# Patient Record
Sex: Female | Born: 1975 | Race: White | Hispanic: No | Marital: Married | Smoking: Never smoker
Health system: Southern US, Community
[De-identification: ages and names within clinical notes are randomized; demographics above are authoritative.]

## PROBLEM LIST (undated history)

## (undated) DIAGNOSIS — I10 Essential (primary) hypertension: Secondary | ICD-10-CM

## (undated) DIAGNOSIS — R0602 Shortness of breath: Secondary | ICD-10-CM

## (undated) HISTORY — PX: WISDOM TOOTH EXTRACTION: SHX21

---

## 2007-09-19 ENCOUNTER — Inpatient Hospital Stay (HOSPITAL_COMMUNITY): Admission: AD | Admit: 2007-09-19 | Discharge: 2007-09-22 | Payer: Self-pay | Admitting: Obstetrics and Gynecology

## 2008-07-23 ENCOUNTER — Emergency Department (HOSPITAL_COMMUNITY): Admission: EM | Admit: 2008-07-23 | Discharge: 2008-07-23 | Payer: Self-pay | Admitting: Family Medicine

## 2010-05-24 NOTE — L&D Delivery Note (Signed)
Delivery Note At 11:36 AM a viable and healthy female was delivered via Vaginal, Spontaneous Delivery (Presentation:Left Occiput ;Anterior  ).  APGAR: 9, 9; weight 6 lb 10.5 oz (3020 g).   Placenta status: Intact, Spontaneous.  Cord: 3 vessels   Anesthesia: Epidural  Episiotomy: None Lacerations: 2nd degree Suture Repair: 3.0 vicryl Est. Blood Loss (mL): 250  Mom to postpartum.  Baby to nursery-stable.  Merrisa Skorupski H. 04/14/2011, 12:04 PM

## 2011-01-14 ENCOUNTER — Encounter (HOSPITAL_COMMUNITY): Payer: Self-pay

## 2011-01-14 ENCOUNTER — Other Ambulatory Visit: Payer: Self-pay

## 2011-01-14 ENCOUNTER — Inpatient Hospital Stay (HOSPITAL_COMMUNITY): Payer: 59

## 2011-01-14 ENCOUNTER — Observation Stay (HOSPITAL_COMMUNITY)
Admission: AD | Admit: 2011-01-14 | Discharge: 2011-01-15 | Disposition: A | Discharge status: Home / Self Care | Payer: 59 | Source: Ambulatory Visit | Attending: Obstetrics and Gynecology | Admitting: Obstetrics and Gynecology

## 2011-01-14 DIAGNOSIS — O99891 Other specified diseases and conditions complicating pregnancy: Principal | ICD-10-CM | POA: Insufficient documentation

## 2011-01-14 DIAGNOSIS — R0602 Shortness of breath: Secondary | ICD-10-CM | POA: Insufficient documentation

## 2011-01-14 DIAGNOSIS — Z331 Pregnant state, incidental: Secondary | ICD-10-CM

## 2011-01-14 HISTORY — DX: Essential (primary) hypertension: I10

## 2011-01-14 LAB — CBC
MCH: 27.3 pg (ref 26.0–34.0)
MCV: 83 fL (ref 78.0–100.0)
Platelets: 280 10*3/uL (ref 150–400)
RDW: 13.9 % (ref 11.5–15.5)
WBC: 11.7 10*3/uL — ABNORMAL HIGH (ref 4.0–10.5)

## 2011-01-14 LAB — COMPREHENSIVE METABOLIC PANEL
AST: 20 U/L (ref 0–37)
Albumin: 3.1 g/dL — ABNORMAL LOW (ref 3.5–5.2)
Calcium: 9.4 mg/dL (ref 8.4–10.5)
Creatinine, Ser: <0.47 mg/dL — ABNORMAL LOW (ref 0.50–1.10)
Sodium: 134 mEq/L — ABNORMAL LOW (ref 135–145)

## 2011-01-14 LAB — FIBRINOGEN: Fibrinogen: 401 mg/dL (ref 204–475)

## 2011-01-14 LAB — LIPASE, BLOOD: Lipase: 52 U/L (ref 11–59)

## 2011-01-14 MED ORDER — ALBUTEROL SULFATE (5 MG/ML) 0.5% IN NEBU
2.5000 mg | INHALATION_SOLUTION | Freq: Four times a day (QID) | RESPIRATORY_TRACT | Status: DC | PRN
  Administered 2011-01-14: 2.5 mg via RESPIRATORY_TRACT
  Filled 2011-01-14: qty 0.5

## 2011-01-14 MED ORDER — ZOLPIDEM TARTRATE 10 MG PO TABS: 10.0000 mg | ORAL_TABLET | Freq: Every evening | ORAL | Status: DC | PRN

## 2011-01-14 MED ORDER — ALBUTEROL SULFATE (5 MG/ML) 0.5% IN NEBU
INHALATION_SOLUTION | RESPIRATORY_TRACT | Status: AC
  Filled 2011-01-14: qty 0.5

## 2011-01-14 MED ORDER — ALBUTEROL SULFATE (5 MG/ML) 0.5% IN NEBU
2.5000 mg | INHALATION_SOLUTION | Freq: Once | RESPIRATORY_TRACT | Status: AC
  Administered 2011-01-14: 2.5 mg via RESPIRATORY_TRACT

## 2011-01-14 MED ORDER — IPRATROPIUM BROMIDE 0.02 % IN SOLN
0.5000 mg | Freq: Four times a day (QID) | RESPIRATORY_TRACT | Status: DC | PRN
  Filled 2011-01-14: qty 2.5

## 2011-01-14 MED ORDER — IPRATROPIUM BROMIDE 0.02 % IN SOLN
0.5000 mg | Freq: Once | RESPIRATORY_TRACT | Status: AC
  Administered 2011-01-14: 0.5 mg via RESPIRATORY_TRACT

## 2011-01-14 MED ORDER — ONDANSETRON HCL 4 MG/2ML IJ SOLN: 4.0000 mg | Freq: Three times a day (TID) | INTRAMUSCULAR | Status: DC | PRN

## 2011-01-14 MED ORDER — IPRATROPIUM BROMIDE 0.02 % IN SOLN
RESPIRATORY_TRACT | Status: AC
  Filled 2011-01-14: qty 2.5

## 2011-01-14 MED ORDER — FAMOTIDINE 20 MG PO TABS
20.0000 mg | ORAL_TABLET | Freq: Two times a day (BID) | ORAL | Status: DC
  Administered 2011-01-14 (×2): 20 mg via ORAL
  Filled 2011-01-14 (×2): qty 1

## 2011-01-14 NOTE — Consult Note (Signed)
Reviewed HPI/med hx/exam>order breathing tx, chest xray; check peak flows before and after treatment; call back with status after breathing treatment.

## 2011-01-14 NOTE — Consult Note (Signed)
Report pt continued status of shortness of breath>reviewed peak flow results>will be in to see patient.

## 2011-01-14 NOTE — Consult Note (Signed)
Reviewed pt status, slight improvement with breathing tx, still working to breath; reviewed xray results and RUQ pain; obtain cbc, cmp, amylase, lipase and call with peak flow results.

## 2011-01-14 NOTE — Progress Notes (Signed)
WMuhammad, CNM back in room w/pt/RN. Will order bloodwork, and call RT for peakflow

## 2011-01-14 NOTE — Progress Notes (Signed)
Pt states she is having shortness of breath and R upper rib pain. Pt denies any bleeding or leaking or contractions. Pt does have asthma but does not feel like this is what it is. Has used her inhaler without results.

## 2011-01-14 NOTE — H&P (Signed)
Please see full H&P by NP.   Pt is 35 yo at 25 weeks c/o worsening SOB last night and continuing to this am.  Pt states that she feels like she has to take deep breaths especially with activity.  Friend states that Pt sounded winded just talking on phone.  Pt say she also has a deep pain on right hand lateral side at level of liver.  The pain is not sharp and makes her feel like she could not take a deep breath but actually can.  Nothing seems to relieve either pain or sensation of SOB.  Pt uses a MDI albuterol inhaler for mild asthma.  No cough or UTI sx.  No ctxes or bleeding; reports fetal movement.    Filed Vitals:   01/14/11 1005 01/14/11 1058  BP: 130/75   Pulse: 88   Temp: 98.5 F (36.9 C)   TempSrc: Oral   Resp: 24   Height: 5\' 2"  (1.575 m)   Weight: 65.499 kg (144 lb 6.4 oz)   SpO2: 99% 98%   SaO2 consistently 95-99% on room air but can go down when pt not breathing in deeply to 84-90%.  Peak Flow after nebulizer by respiratory was 350.  LABS :  WBC 11.7, H/H 11/35, plt 280.  LFTS 20/11.  Fibrinogen 401, BUN/Cr 11/<0.47.  CXR:  No cardiopulm abnormalities seen.  Lungs clear.  PE:  Pt gasps audibly with every breath.  No wheezing.  Lungs CTA B- absolutely clear. Cor:  RRR no MRG. Ex:  Completely normal bilaterally-no cords, edema, erythema, or homan's. +FHTs.  A:  25 weeks normal pregnancy with SOB of unknown reason.  SAO2 drops occasionally.  Pt has hx of asthma but peak flow is great and she is not responding to nebulizer.   Diff Dx: 1.  Asthma- peak flow fine.  Will have respiratory continue to see. 2.  Early pneumonia- CXR clear now and afeb but she does have mild elevation of WBC.  Watch temps and recheck CBC in am.  Consider antibiotics. 3.  PE- currently no evidence of any clotting.  If SOB, SaO2 worsens may consider Spiral CT of chest with shielding. 4.  Cardiac reason- check EKG now.     P:  Admit 24 hour obs for further evaluation.  Continue Respiratory nebs.   Check EKG now and CBC in am.  Consider repeat CXR in am or Spiral CT if worsens.  Consider antibiotics.  Latoria Dry A

## 2011-01-14 NOTE — Progress Notes (Signed)
Pt states used inhaler last pm, this am is feeling more shortness of breath. Denies anxiety. No bleeding or lof, +FM.

## 2011-01-15 LAB — CBC
HCT: 34.2 % — ABNORMAL LOW (ref 36.0–46.0)
Hemoglobin: 11.2 g/dL — ABNORMAL LOW (ref 12.0–15.0)
MCH: 27.3 pg (ref 26.0–34.0)
MCHC: 32.7 g/dL (ref 30.0–36.0)

## 2011-01-15 LAB — COMPREHENSIVE METABOLIC PANEL
Alkaline Phosphatase: 67 U/L (ref 39–117)
BUN: 10 mg/dL (ref 6–23)
CO2: 24 mEq/L (ref 19–32)
Calcium: 8.9 mg/dL (ref 8.4–10.5)
GFR calc Af Amer: >60 mL/min (ref >60)
GFR calc non Af Amer: >60 mL/min (ref >60)
Glucose, Bld: 82 mg/dL (ref 70–99)
Total Protein: 5.7 g/dL — ABNORMAL LOW (ref 6.0–8.3)

## 2011-01-15 NOTE — Progress Notes (Signed)
UR Chart review completed.  

## 2011-01-15 NOTE — Discharge Summary (Signed)
Ana Jenkins is doing much better this morning. She is having no shortness of breath or difficulty breathing whatsoever.  She has been off of oxygen for least the past 4-5 hours. Her O2 sats have been normal. Her hemoglobin this morning was 11.2, white count 10,800, and platelet count 253,000. Her and comprehensive metabolic profile were normal. She notes good fetal movement and is desirous of discharge.  Discharge diagnoses:  1. 25-26 week intrauterine pregnancy, undelivered  #2 episode of difficulty breathing and shortness of breath, resolved  Plan-the patient was given a Rx  for a ProAir HFA  90 mcg inhaler with appropriate instructions. She will keep her followup appointments in the office as scheduled or be seen as needed. Condition on discharge-improved.

## 2011-01-15 NOTE — Discharge Summary (Signed)
Instructions given to pt. Per Dr. Aldona Bar and pt. And significant other state understanding of these instructions.

## 2011-01-15 NOTE — Progress Notes (Signed)
  Much improved this AM; has not been on oxygen for a long while and breathing normally.  Labs this AM OK. Will discharge to home.

## 2011-01-28 LAB — ABO/RH: RH Type: POSITIVE

## 2011-01-28 LAB — HEPATITIS B SURFACE ANTIGEN: Hepatitis B Surface Ag: NEGATIVE

## 2011-01-28 LAB — RUBELLA ANTIBODY, IGM: Rubella: IMMUNE

## 2011-02-16 LAB — CBC
HCT: 30.6 — ABNORMAL LOW
HCT: 36.5
Hemoglobin: 10.5 — ABNORMAL LOW
Hemoglobin: 12.6
MCHC: 34.4
MCV: 81.4
Platelets: 278
RBC: 3.76 — ABNORMAL LOW
RDW: 14
WBC: 15.1 — ABNORMAL HIGH

## 2011-02-16 LAB — COMPREHENSIVE METABOLIC PANEL
ALT: 15
AST: 21
Alkaline Phosphatase: 114
Calcium: 9.5
GFR calc Af Amer: >60
Glucose, Bld: 79
Potassium: 4.2
Sodium: 134 — ABNORMAL LOW
Total Protein: 5.7 — ABNORMAL LOW

## 2011-02-16 LAB — LACTATE DEHYDROGENASE: LDH: 145

## 2011-04-14 ENCOUNTER — Encounter (HOSPITAL_COMMUNITY): Payer: Self-pay | Admitting: Anesthesiology

## 2011-04-14 ENCOUNTER — Inpatient Hospital Stay (HOSPITAL_COMMUNITY)
Admission: AD | Admit: 2011-04-14 | Discharge: 2011-04-15 | DRG: 775 | Disposition: A | Discharge status: Home / Self Care | Payer: 59 | Source: Ambulatory Visit | Attending: Obstetrics and Gynecology | Admitting: Obstetrics and Gynecology

## 2011-04-14 ENCOUNTER — Encounter (HOSPITAL_COMMUNITY): Payer: Self-pay | Admitting: Obstetrics

## 2011-04-14 ENCOUNTER — Inpatient Hospital Stay (HOSPITAL_COMMUNITY): Payer: 59 | Admitting: Anesthesiology

## 2011-04-14 DIAGNOSIS — O09529 Supervision of elderly multigravida, unspecified trimester: Secondary | ICD-10-CM | POA: Diagnosis present

## 2011-04-14 HISTORY — DX: Shortness of breath: R06.02

## 2011-04-14 LAB — CBC
HCT: 36.2 % (ref 36.0–46.0)
MCH: 27.4 pg (ref 26.0–34.0)
MCV: 81.3 fL (ref 78.0–100.0)
Platelets: 258 10*3/uL (ref 150–400)
RDW: 13.8 % (ref 11.5–15.5)

## 2011-04-14 LAB — RPR: RPR: NONREACTIVE

## 2011-04-14 LAB — STREP B DNA PROBE: GBS: NEGATIVE

## 2011-04-14 LAB — SYPHILIS: RPR W/REFLEX TO RPR TITER AND TREPONEMAL ANTIBODIES, TRADITIONAL SCREENING AND DIAGNOSIS ALGORITHM: RPR Ser Ql: NONREACTIVE

## 2011-04-14 MED ORDER — FLEET ENEMA 7-19 GM/118ML RE ENEM: 1.0000 | ENEMA | RECTAL | Status: DC | PRN

## 2011-04-14 MED ORDER — BUTORPHANOL TARTRATE 2 MG/ML IJ SOLN: 1.0000 mg | INTRAMUSCULAR | Status: DC | PRN

## 2011-04-14 MED ORDER — DIPHENHYDRAMINE HCL 50 MG/ML IJ SOLN: 12.5000 mg | INTRAMUSCULAR | Status: DC | PRN

## 2011-04-14 MED ORDER — PHENYLEPHRINE 40 MCG/ML (10ML) SYRINGE FOR IV PUSH (FOR BLOOD PRESSURE SUPPORT): 80.0000 ug | PREFILLED_SYRINGE | INTRAVENOUS | Status: DC | PRN

## 2011-04-14 MED ORDER — LIDOCAINE HCL (PF) 1 % IJ SOLN
30.0000 mL | INTRAMUSCULAR | Status: DC | PRN
  Administered 2011-04-14: 30 mL via SUBCUTANEOUS
  Filled 2011-04-14: qty 30

## 2011-04-14 MED ORDER — CITRIC ACID-SODIUM CITRATE 334-500 MG/5ML PO SOLN: 30.0000 mL | ORAL | Status: DC | PRN

## 2011-04-14 MED ORDER — PREDNISONE 10 MG PO TABS
30.0000 mg | ORAL_TABLET | Freq: Every day | ORAL | Status: DC
  Administered 2011-04-14: 30 mg via ORAL
  Filled 2011-04-14: qty 3

## 2011-04-14 MED ORDER — OXYTOCIN 20 UNITS IN LACTATED RINGERS INFUSION - SIMPLE
125.0000 mL/h | Freq: Once | INTRAVENOUS | Status: AC
  Administered 2011-04-14: 999 mL/h via INTRAVENOUS

## 2011-04-14 MED ORDER — FLUTICASONE PROPIONATE HFA 44 MCG/ACT IN AERO
2.0000 | INHALATION_SPRAY | Freq: Two times a day (BID) | RESPIRATORY_TRACT | Status: DC
  Administered 2011-04-14 – 2011-04-15 (×2): 2 via RESPIRATORY_TRACT
  Filled 2011-04-14: qty 10.6

## 2011-04-14 MED ORDER — PREDNISONE 50 MG PO TABS
60.0000 mg | ORAL_TABLET | Freq: Every day | ORAL | Status: DC
  Filled 2011-04-14 (×3): qty 1

## 2011-04-14 MED ORDER — SENNOSIDES-DOCUSATE SODIUM 8.6-50 MG PO TABS
2.0000 | ORAL_TABLET | Freq: Every day | ORAL | Status: DC
  Administered 2011-04-14: 2 via ORAL

## 2011-04-14 MED ORDER — ONDANSETRON HCL 4 MG/2ML IJ SOLN: 4.0000 mg | Freq: Four times a day (QID) | INTRAMUSCULAR | Status: DC | PRN

## 2011-04-14 MED ORDER — BENZOCAINE-MENTHOL 20-0.5 % EX AERO
INHALATION_SPRAY | CUTANEOUS | Status: AC
  Administered 2011-04-14: 1 via TOPICAL
  Filled 2011-04-14: qty 56

## 2011-04-14 MED ORDER — ONDANSETRON HCL 4 MG PO TABS: 4.0000 mg | ORAL_TABLET | ORAL | Status: DC | PRN

## 2011-04-14 MED ORDER — INFLUENZA VIRUS VACC SPLIT PF IM SUSP
0.5000 mL | INTRAMUSCULAR | Status: DC
  Filled 2011-04-14: qty 0.5

## 2011-04-14 MED ORDER — ONDANSETRON HCL 4 MG/2ML IJ SOLN: 4.0000 mg | INTRAMUSCULAR | Status: DC | PRN

## 2011-04-14 MED ORDER — PHENYLEPHRINE 40 MCG/ML (10ML) SYRINGE FOR IV PUSH (FOR BLOOD PRESSURE SUPPORT)
80.0000 ug | PREFILLED_SYRINGE | INTRAVENOUS | Status: DC | PRN
  Filled 2011-04-14: qty 5

## 2011-04-14 MED ORDER — IBUPROFEN 600 MG PO TABS
600.0000 mg | ORAL_TABLET | Freq: Four times a day (QID) | ORAL | Status: DC | PRN
  Administered 2011-04-14: 600 mg via ORAL
  Filled 2011-04-14: qty 1

## 2011-04-14 MED ORDER — ZOLPIDEM TARTRATE 5 MG PO TABS: 5.0000 mg | ORAL_TABLET | Freq: Every evening | ORAL | Status: DC | PRN

## 2011-04-14 MED ORDER — PRENATAL PLUS 27-1 MG PO TABS
1.0000 | ORAL_TABLET | Freq: Every day | ORAL | Status: DC
  Administered 2011-04-14 – 2011-04-15 (×2): 1 via ORAL
  Filled 2011-04-14 (×2): qty 1

## 2011-04-14 MED ORDER — ACETAMINOPHEN 325 MG PO TABS: 650.0000 mg | ORAL_TABLET | ORAL | Status: DC | PRN

## 2011-04-14 MED ORDER — TERBUTALINE SULFATE 1 MG/ML IJ SOLN: 0.2500 mg | Freq: Once | INTRAMUSCULAR | Status: DC | PRN

## 2011-04-14 MED ORDER — IBUPROFEN 600 MG PO TABS
600.0000 mg | ORAL_TABLET | Freq: Four times a day (QID) | ORAL | Status: DC
  Administered 2011-04-14 – 2011-04-15 (×4): 600 mg via ORAL
  Filled 2011-04-14 (×4): qty 1

## 2011-04-14 MED ORDER — METHYLERGONOVINE MALEATE 0.2 MG PO TABS: 0.2000 mg | ORAL_TABLET | ORAL | Status: DC | PRN

## 2011-04-14 MED ORDER — OXYTOCIN BOLUS FROM INFUSION
500.0000 mL | Freq: Once | INTRAVENOUS | Status: DC
  Filled 2011-04-14: qty 500

## 2011-04-14 MED ORDER — FENTANYL 2.5 MCG/ML BUPIVACAINE 1/10 % EPIDURAL INFUSION (WH - ANES)
14.0000 mL/h | INTRAMUSCULAR | Status: DC
  Administered 2011-04-14 (×2): 14 mL/h via EPIDURAL
  Filled 2011-04-14 (×3): qty 60

## 2011-04-14 MED ORDER — LACTATED RINGERS IV SOLN
INTRAVENOUS | Status: DC
  Administered 2011-04-14: 07:00:00 via INTRAVENOUS

## 2011-04-14 MED ORDER — SODIUM CHLORIDE 0.9 % IJ SOLN: 3.0000 mL | INTRAMUSCULAR | Status: DC | PRN

## 2011-04-14 MED ORDER — OXYTOCIN 20 UNITS IN LACTATED RINGERS INFUSION - SIMPLE
1.0000 m[IU]/min | INTRAVENOUS | Status: DC
  Administered 2011-04-14: 2 m[IU]/min via INTRAVENOUS
  Filled 2011-04-14: qty 1000

## 2011-04-14 MED ORDER — LACTATED RINGERS IV SOLN: 500.0000 mL | INTRAVENOUS | Status: DC | PRN

## 2011-04-14 MED ORDER — DIBUCAINE 1 % RE OINT: 1.0000 "application " | TOPICAL_OINTMENT | RECTAL | Status: DC | PRN

## 2011-04-14 MED ORDER — ALBUTEROL SULFATE (5 MG/ML) 0.5% IN NEBU
2.5000 mg | INHALATION_SOLUTION | RESPIRATORY_TRACT | Status: DC | PRN
  Administered 2011-04-14 (×2): 2.5 mg via RESPIRATORY_TRACT
  Filled 2011-04-14: qty 0.5

## 2011-04-14 MED ORDER — DIPHENHYDRAMINE HCL 25 MG PO CAPS: 25.0000 mg | ORAL_CAPSULE | Freq: Four times a day (QID) | ORAL | Status: DC | PRN

## 2011-04-14 MED ORDER — FENTANYL 2.5 MCG/ML BUPIVACAINE 1/10 % EPIDURAL INFUSION (WH - ANES)
INTRAMUSCULAR | Status: DC | PRN
  Administered 2011-04-14: 14 mL/h via EPIDURAL

## 2011-04-14 MED ORDER — BENZOCAINE-MENTHOL 20-0.5 % EX AERO
1.0000 "application " | INHALATION_SPRAY | CUTANEOUS | Status: DC | PRN
  Administered 2011-04-14: 1 via TOPICAL

## 2011-04-14 MED ORDER — SODIUM BICARBONATE 8.4 % IV SOLN
INTRAVENOUS | Status: DC | PRN
  Administered 2011-04-14: 5 mL via EPIDURAL

## 2011-04-14 MED ORDER — EPHEDRINE 5 MG/ML INJ
10.0000 mg | INTRAVENOUS | Status: DC | PRN
  Filled 2011-04-14: qty 4

## 2011-04-14 MED ORDER — METHYLERGONOVINE MALEATE 0.2 MG/ML IJ SOLN: 0.2000 mg | INTRAMUSCULAR | Status: DC | PRN

## 2011-04-14 MED ORDER — EPHEDRINE 5 MG/ML INJ: 10.0000 mg | INTRAVENOUS | Status: DC | PRN

## 2011-04-14 MED ORDER — LANOLIN HYDROUS EX OINT: TOPICAL_OINTMENT | CUTANEOUS | Status: DC | PRN

## 2011-04-14 MED ORDER — BUDESONIDE 180 MCG/ACT IN AEPB
1.0000 | INHALATION_SPRAY | Freq: Two times a day (BID) | RESPIRATORY_TRACT | Status: DC
  Administered 2011-04-14 – 2011-04-15 (×3): 1 via RESPIRATORY_TRACT
  Filled 2011-04-14: qty 1

## 2011-04-14 MED ORDER — SIMETHICONE 80 MG PO CHEW: 80.0000 mg | CHEWABLE_TABLET | ORAL | Status: DC | PRN

## 2011-04-14 MED ORDER — LACTATED RINGERS IV SOLN
500.0000 mL | Freq: Once | INTRAVENOUS | Status: AC
  Administered 2011-04-14: 500 mL via INTRAVENOUS

## 2011-04-14 MED ORDER — TETANUS-DIPHTH-ACELL PERTUSSIS 5-2.5-18.5 LF-MCG/0.5 IM SUSP
0.5000 mL | Freq: Once | INTRAMUSCULAR | Status: AC
  Administered 2011-04-15: 0.5 mL via INTRAMUSCULAR
  Filled 2011-04-14 (×2): qty 0.5

## 2011-04-14 MED ORDER — WITCH HAZEL-GLYCERIN EX PADS: 1.0000 "application " | MEDICATED_PAD | CUTANEOUS | Status: DC | PRN

## 2011-04-14 MED ORDER — PREDNISONE 20 MG PO TABS
30.0000 mg | ORAL_TABLET | Freq: Once | ORAL | Status: AC
  Administered 2011-04-14: 30 mg via ORAL
  Filled 2011-04-14: qty 1

## 2011-04-14 MED ORDER — OXYCODONE-ACETAMINOPHEN 5-325 MG PO TABS
1.0000 | ORAL_TABLET | ORAL | Status: DC | PRN
  Administered 2011-04-15: 1 via ORAL
  Filled 2011-04-14: qty 1

## 2011-04-14 MED ORDER — PREDNISONE 50 MG PO TABS
60.0000 mg | ORAL_TABLET | Freq: Every day | ORAL | Status: DC
  Administered 2011-04-15: 60 mg via ORAL
  Filled 2011-04-14 (×2): qty 1

## 2011-04-14 MED ORDER — ALBUTEROL SULFATE (5 MG/ML) 0.5% IN NEBU
2.5000 mg | INHALATION_SOLUTION | RESPIRATORY_TRACT | Status: DC | PRN
  Administered 2011-04-14: 2.5 mg via RESPIRATORY_TRACT
  Filled 2011-04-14: qty 0.5

## 2011-04-14 MED ORDER — OXYCODONE-ACETAMINOPHEN 5-325 MG PO TABS
2.0000 | ORAL_TABLET | ORAL | Status: DC | PRN
  Administered 2011-04-14: 2 via ORAL
  Filled 2011-04-14: qty 2

## 2011-04-14 NOTE — Progress Notes (Signed)
PT SAYS  SHE WENT TO FAMILY DR TODAY- FOR ASTHMA.    SAYS HER SROM AT 0045.   UC  STARTED ON WAY  TO HOSPITAL.   DENIES HSV AND MRSA.

## 2011-04-14 NOTE — Anesthesia Procedure Notes (Signed)
Epidural Patient location during procedure: OB  Preanesthetic Checklist Completed: patient identified, site marked, surgical consent, pre-op evaluation, timeout performed, IV checked, risks and benefits discussed and monitors and equipment checked  Epidural Patient position: sitting Prep: site prepped and draped and DuraPrep Patient monitoring: continuous pulse ox and blood pressure Approach: midline Injection technique: LOR air  Needle:  Needle type: Tuohy  Needle gauge: 17 G Needle length: 9 cm Needle insertion depth: 6 cm Catheter type: closed end flexible Catheter size: 19 Gauge Catheter at skin depth: 11 cm Test dose: negative  Assessment Events: blood not aspirated, injection not painful, no injection resistance, negative IV test and no paresthesia  Additional Notes Dosing of Epidural:  1st dose, through needle ............................................. epi 1:200K + Xylocaine 40 mg  2nd dose, through catheter, after waiting 3 minutes.....epi 1:200K + Xylocaine 60 mg  3rd dose, through catheter after waiting 3 minutes .............................Marcaine   5mg   ( mg Marcaine are expressed as equivilent  cc's medication removed from the 0.1%Bupiv / fentanyl syringe from L&D pump)  ( 2% Xylo charted as a single dose in Epic Meds for ease of charting; actual dosing was fractionated as above, for saftey's sake)  As each dose occurred, patient was free of IV sx; and patient exhibited no evidence of SA injection.  Patient is more comfortable after epidural dosed. Please see RN's note for documentation of vital signs,and FHR which are stable.       

## 2011-04-14 NOTE — Progress Notes (Signed)
CALLED COLEY, RN FOR ROOM ASSIGNMENT - SAYS WILL CALL BACK

## 2011-04-14 NOTE — Anesthesia Preprocedure Evaluation (Addendum)
Anesthesia Evaluation  Patient identified by MRN, date of birth, ID band Patient awake    Reviewed: Allergy & Precautions, H&P , Patient's Chart, lab work & pertinent test results  Airway Mallampati: II TM Distance: >3 FB Neck ROM: full    Dental  (+) Teeth Intact   Pulmonary asthma (Recent URI, triggering asthma attacks. Now on Prednisone, and doing better) ,  clear to auscultation        Cardiovascular hypertension (None now, only with prev preg), regular Normal    Neuro/Psych    GI/Hepatic   Endo/Other    Renal/GU      Musculoskeletal   Abdominal   Peds  Hematology   Anesthesia Other Findings       Reproductive/Obstetrics (+) Pregnancy                          Anesthesia Physical Anesthesia Plan  ASA: II  Anesthesia Plan: Epidural   Post-op Pain Management:    Induction:   Airway Management Planned:   Additional Equipment:   Intra-op Plan:   Post-operative Plan:   Informed Consent: I have reviewed the patients History and Physical, chart, labs and discussed the procedure including the risks, benefits and alternatives for the proposed anesthesia with the patient or authorized representative who has indicated his/her understanding and acceptance.   Dental Advisory Given  Plan Discussed with:   Anesthesia Plan Comments: (Labs checked- platelets confirmed with RN in room. Fetal heart tracing, per RN, reported to be stable enough for sitting procedure. Discussed epidural, and patient consents to the procedure:  included risk of possible headache,backache, failed block, allergic reaction, and nerve injury. This patient was asked if she had any questions or concerns before the procedure started. )        Anesthesia Quick Evaluation

## 2011-04-14 NOTE — H&P (Signed)
Ana Jenkins is a 35 y.o. female G2P1001 presenting for Labor Pts pregnancy has been complicated by asthma exacerbations.  She was seen by her PCP yesterday and placed on a prednisone burst of 60 mg for 5 days. Pt is also AMA.  First trimester screen was WNL, declined AFP. Had SROM after midnight. History OB History    Grav Para Term Preterm Abortions TAB SAB Ect Mult Living   2 1 1       1      Past Medical History  Diagnosis Date  . Hypertension   . Asthma   . Shortness of breath    Past Surgical History  Procedure Date  . Wisdom tooth extraction    Family History: family history is not on file. Social History:  reports that she has never smoked. She has never used smokeless tobacco. She reports that she does not drink alcohol or use illicit drugs.  ROS  Dilation: 9 Effacement (%): 100 Station: 0 Exam by:: C.Woods,RN Blood pressure 133/84, pulse 108, temperature 97.2 F (36.2 C), temperature source Oral, resp. rate 20, height 5\' 2"  (1.575 m), weight 73.993 kg (163 lb 2 oz), SpO2 97.00%. Exam Physical Exam  Prenatal labs: ABO, Rh: B/Positive/-- (09/06 0000) Antibody: Negative (09/06 0000) Rubella: Immune (09/06 0000) RPR: Nonreactive (11/21 0000)  HBsAg: Negative (09/06 0000)  HIV: Non-reactive (11/21 0000)  GBS: Negative (11/21 0000)   Assessment/Plan: 1) Admit 2) Expectant Management 3) Continue Prednisone burst  Ana Jenkins H. 04/14/2011, 10:36 AM

## 2011-04-15 LAB — CBC
MCH: 27.5 pg (ref 26.0–34.0)
MCHC: 32.9 g/dL (ref 30.0–36.0)
RDW: 14 % (ref 11.5–15.5)

## 2011-04-15 MED ORDER — ALBUTEROL SULFATE (5 MG/ML) 0.5% IN NEBU: 2.5000 mg | INHALATION_SOLUTION | RESPIRATORY_TRACT | Status: AC | PRN

## 2011-04-15 MED ORDER — IBUPROFEN 600 MG PO TABS: 600.0000 mg | ORAL_TABLET | Freq: Four times a day (QID) | ORAL | Status: AC

## 2011-04-15 NOTE — Discharge Summary (Signed)
Obstetric Discharge Summary Reason for Admission: onset of labor and rupture of membranes Prenatal Procedures: none Intrapartum Procedures: spontaneous vaginal delivery Postpartum Procedures: none Complications-Operative and Postpartum: none Hemoglobin  Date Value Range Status  04/15/2011 10.2* 12.0-15.0 (g/dL) Final     HCT  Date Value Range Status  04/15/2011 31.0* 36.0-46.0 (%) Final    Discharge Diagnoses: Term Pregnancy-delivered  Discharge Information: Date: 04/15/2011 Activity: unrestricted Diet: routine Medications: PNV, Ibuprofen and proventil albuterol nebulizer, predinisone burst Condition: stable Instructions: refer to practice specific booklet Discharge to: home Follow-up Information    Follow up with [name],[name] in 4 weeks.   Contact information:   [address] [place] [address] [phone]          Newborn Data: Live born female  Birth Weight: 6 lb 10.5 oz (3020 g) APGAR: 9, 9  Home with mother.  [name] 04/15/2011, 10:43 AM

## 2011-04-15 NOTE — Anesthesia Postprocedure Evaluation (Signed)
  Anesthesia Post-op Note  Patient: Ana Jenkins  Procedure(s) Performed: * No procedures listed *  Patient Location: Mother/Baby  Anesthesia Type: Epidural  Level of Consciousness: alert  and oriented  Airway and Oxygen Therapy: Patient Spontanous Breathing  Post-op Pain: mild  Post-op Assessment: Patient's Cardiovascular Status Stable and Respiratory Function Stable  Post-op Vital Signs: stable  Complications: No apparent anesthesia complications

## 2011-04-15 NOTE — Addendum Note (Signed)
Addendum  created 04/15/11 1207 by Fanny Dance   Modules edited:Notes Section

## 2011-06-20 ENCOUNTER — Emergency Department (INDEPENDENT_AMBULATORY_CARE_PROVIDER_SITE_OTHER)
Admission: EM | Admit: 2011-06-20 | Discharge: 2011-06-20 | Disposition: A | Discharge status: Home / Self Care | Payer: 59 | Source: Home / Self Care | Attending: Emergency Medicine | Admitting: Emergency Medicine

## 2011-06-20 ENCOUNTER — Encounter (HOSPITAL_COMMUNITY): Payer: Self-pay | Admitting: *Deleted

## 2011-06-20 DIAGNOSIS — M5380 Other specified dorsopathies, site unspecified: Secondary | ICD-10-CM

## 2011-06-20 DIAGNOSIS — N390 Urinary tract infection, site not specified: Secondary | ICD-10-CM

## 2011-06-20 DIAGNOSIS — M62830 Muscle spasm of back: Secondary | ICD-10-CM

## 2011-06-20 LAB — POCT URINALYSIS DIP (DEVICE)
Glucose, UA: NEGATIVE mg/dL
Hgb urine dipstick: NEGATIVE
Nitrite: NEGATIVE
Urobilinogen, UA: 1 mg/dL (ref 0.0–1.0)
pH: 7 (ref 5.0–8.0)

## 2011-06-20 MED ORDER — IBUPROFEN 800 MG PO TABS
800.0000 mg | ORAL_TABLET | Freq: Once | ORAL | Status: AC
  Administered 2011-06-20: 800 mg via ORAL

## 2011-06-20 MED ORDER — IBUPROFEN 800 MG PO TABS
ORAL_TABLET | ORAL | Status: AC
  Filled 2011-06-20: qty 1

## 2011-06-20 MED ORDER — DIAZEPAM 5 MG PO TABS: 5.0000 mg | ORAL_TABLET | Freq: Four times a day (QID) | ORAL | Status: AC | PRN

## 2011-06-20 MED ORDER — NITROFURANTOIN MONOHYD MACRO 100 MG PO CAPS: 100.0000 mg | ORAL_CAPSULE | Freq: Two times a day (BID) | ORAL | Status: AC

## 2011-06-20 MED ORDER — DIAZEPAM 5 MG/ML IJ SOLN
5.0000 mg | Freq: Once | INTRAMUSCULAR | Status: AC
  Administered 2011-06-20: 5 mg via INTRAMUSCULAR

## 2011-06-20 MED ORDER — DIAZEPAM 5 MG/ML IJ SOLN
INTRAMUSCULAR | Status: AC
  Filled 2011-06-20: qty 2

## 2011-06-20 MED ORDER — IBUPROFEN 600 MG PO TABS: 600.0000 mg | ORAL_TABLET | Freq: Four times a day (QID) | ORAL | Status: AC | PRN

## 2011-06-20 NOTE — ED Notes (Signed)
Co left mid back pain starting this am, denies injury, pain nonradiating, pt is breastfeeding

## 2011-06-20 NOTE — ED Provider Notes (Signed)
History     CSN: [id number]  Arrival date & time 06/20/11  1520   First MD Initiated Contact with Patient 06/20/11 1525      Chief Complaint  Patient presents with  . Back Pain    (Consider location/radiation/quality/duration/timing/severity/associated sxs/prior treatment) HPI Comments: Patient with acute onset of nonradiating sharp crampy left paralumbar lower thoracic pain starting after leaning over to the right to pick something up this morning. Did not hear a pop. Pain worse with movement torso rotation bending forward. No alleviating factors. Patient also reports several days of intermittent, slight, nonradiating left lower back pain. No urinary urgency, frequency, hematuria, cloudy or oderous urine, vaginal bleeding, vaginal discharge. No nausea, vomiting, abdominal pain, fevers.  Patient is a 36 y.o. female presenting with back pain. The history is provided by the patient. No language interpreter was used.  Back Pain  This is a new problem. The current episode started 6 to 12 hours ago. The problem occurs constantly. The problem has been gradually worsening. The pain is associated with twisting. The pain is present in the thoracic spine. The quality of the pain is described as cramping. The pain does not radiate. The symptoms are aggravated by bending, twisting and certain positions. She has tried nothing for the symptoms.    Past Medical History  Diagnosis Date  . Hypertension   . Asthma   . Shortness of breath     Past Surgical History  Procedure Date  . Wisdom tooth extraction     History reviewed. No pertinent family history.  History  Substance Use Topics  . Smoking status: Never Smoker   . Smokeless tobacco: Never Used  . Alcohol Use: No    OB History    Grav Para Term Preterm Abortions TAB SAB Ect Mult Living   2 2 2       2       Review of Systems  Musculoskeletal: Positive for back pain.    Allergies  Sulfa antibiotics  Home Medications    Current Outpatient Rx  Name Route Sig Dispense Refill  . ALBUTEROL SULFATE HFA 108 (90 BASE) MCG/ACT IN AERS Inhalation Inhale 2 puffs into the lungs every 6 (six) hours as needed.    . ALBUTEROL SULFATE (5 MG/ML) 0.5% IN NEBU Nebulization Take 0.5 mLs (2.5 mg total) by nebulization every 4 (four) hours as needed for wheezing or shortness of breath. 20 mL 1  . BUDESONIDE 180 MCG/ACT IN AEPB Inhalation Inhale 1 puff into the lungs 2 (two) times daily.      Marland Kitchen DIAZEPAM 5 MG PO TABS Oral Take 1 tablet (5 mg total) by mouth every 6 (six) hours as needed for anxiety. 16 tablet 0  . IBUPROFEN 600 MG PO TABS Oral Take 1 tablet (600 mg total) by mouth every 6 (six) hours as needed for pain. 30 tablet 0  . NITROFURANTOIN MONOHYD MACRO 100 MG PO CAPS Oral Take 1 capsule (100 mg total) by mouth 2 (two) times daily. 10 capsule 0    BP 128/87  Pulse 86  Temp(Src) 97.6 F (36.4 C) (Oral)  Resp 20  SpO2 99%  Breastfeeding? Yes  Physical Exam  Nursing note and vitals reviewed. Constitutional: She appears well-developed and well-nourished.       Crying, appears to be in moderate painful distress  Eyes: Conjunctivae and EOM are normal.  Neck: Normal range of motion.  Cardiovascular: Normal rate, regular rhythm, normal heart sounds and intact distal pulses.   Pulmonary/Chest: Effort normal  and breath sounds normal.  Abdominal: Soft. Bowel sounds are normal. She exhibits no distension. There is no tenderness. There is no CVA tenderness.  Musculoskeletal: Normal range of motion.       Lumbar back: She exhibits tenderness, pain and spasm. She exhibits no bony tenderness.       Back:  Skin: Skin is warm and dry.    ED Course  Procedures (including critical care time)  Labs Reviewed  POCT URINALYSIS DIP (DEVICE) - Abnormal; Notable for the following:    Bilirubin Urine SMALL (*)    Protein, ur 30 (*)    Leukocytes, UA MODERATE (*) Biochemical Testing Only. Please order routine urinalysis from  main lab if confirmatory testing is needed.   All other components within normal limits  POCT PREGNANCY, URINE  POCT PREGNANCY, URINE  POCT URINALYSIS DIPSTICK   No results found.   1. UTI (lower urinary tract infection)   2. Muscle spasm of back       MDM  Patient has left lower thoracic and paralumbar muscle spasm. Appears to be in significant amount of distress. Giving Valium 1 mg IM and Motrin 800 mg by mouth. Patient also with UTI on UA. Abdomen soft, nontender. Suspect that back pain preceding today's pain was due to the UTI. Will treat. Will have followup with her PMD if no improvement in several days.  Luiz Blare, MD 06/20/11 2138

## 2012-06-19 IMAGING — CR DG CHEST 2V
2 series · 2 of 2 positions shown · non-contrast
Comparison: None.

CLINICAL DATA: Shortness of breath.
Currently pregnant. Shielding of the abdomen and pelvis was
performed.

CHEST - 2 VIEW

[view not recorded (1 of 2)]
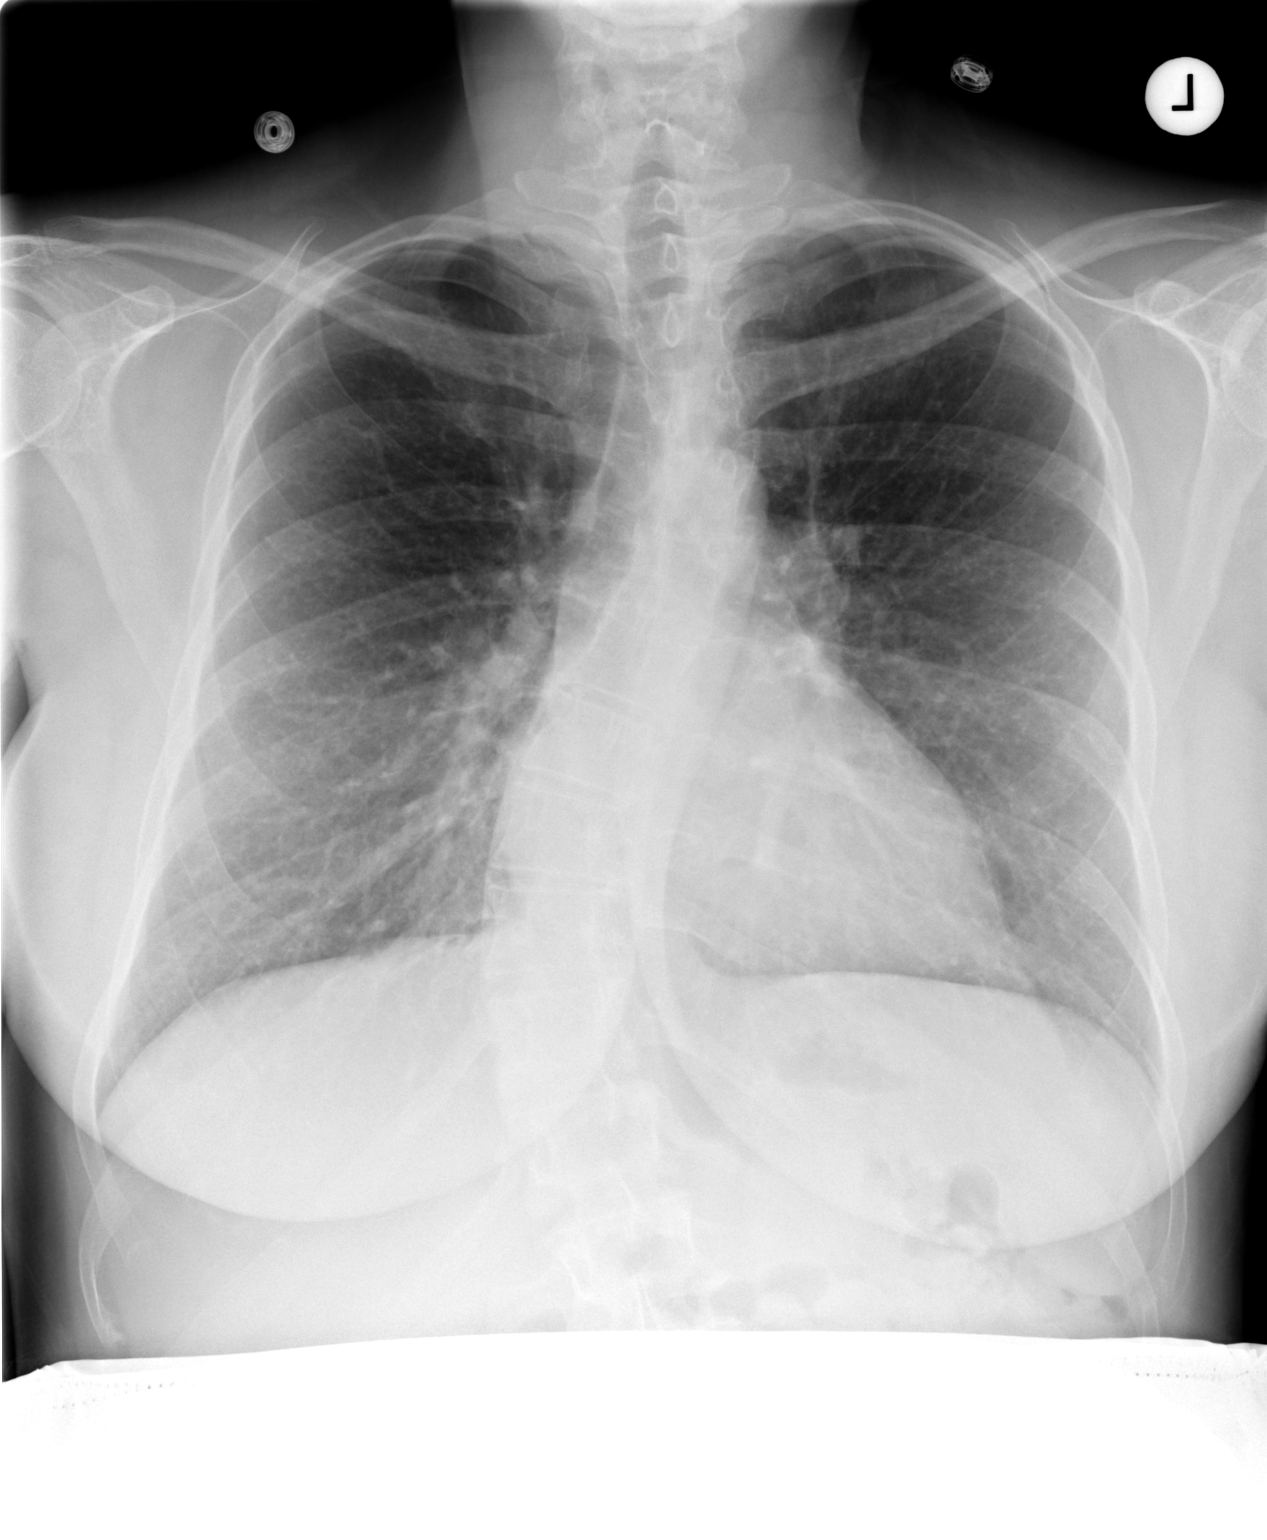

[view not recorded (2 of 2)]
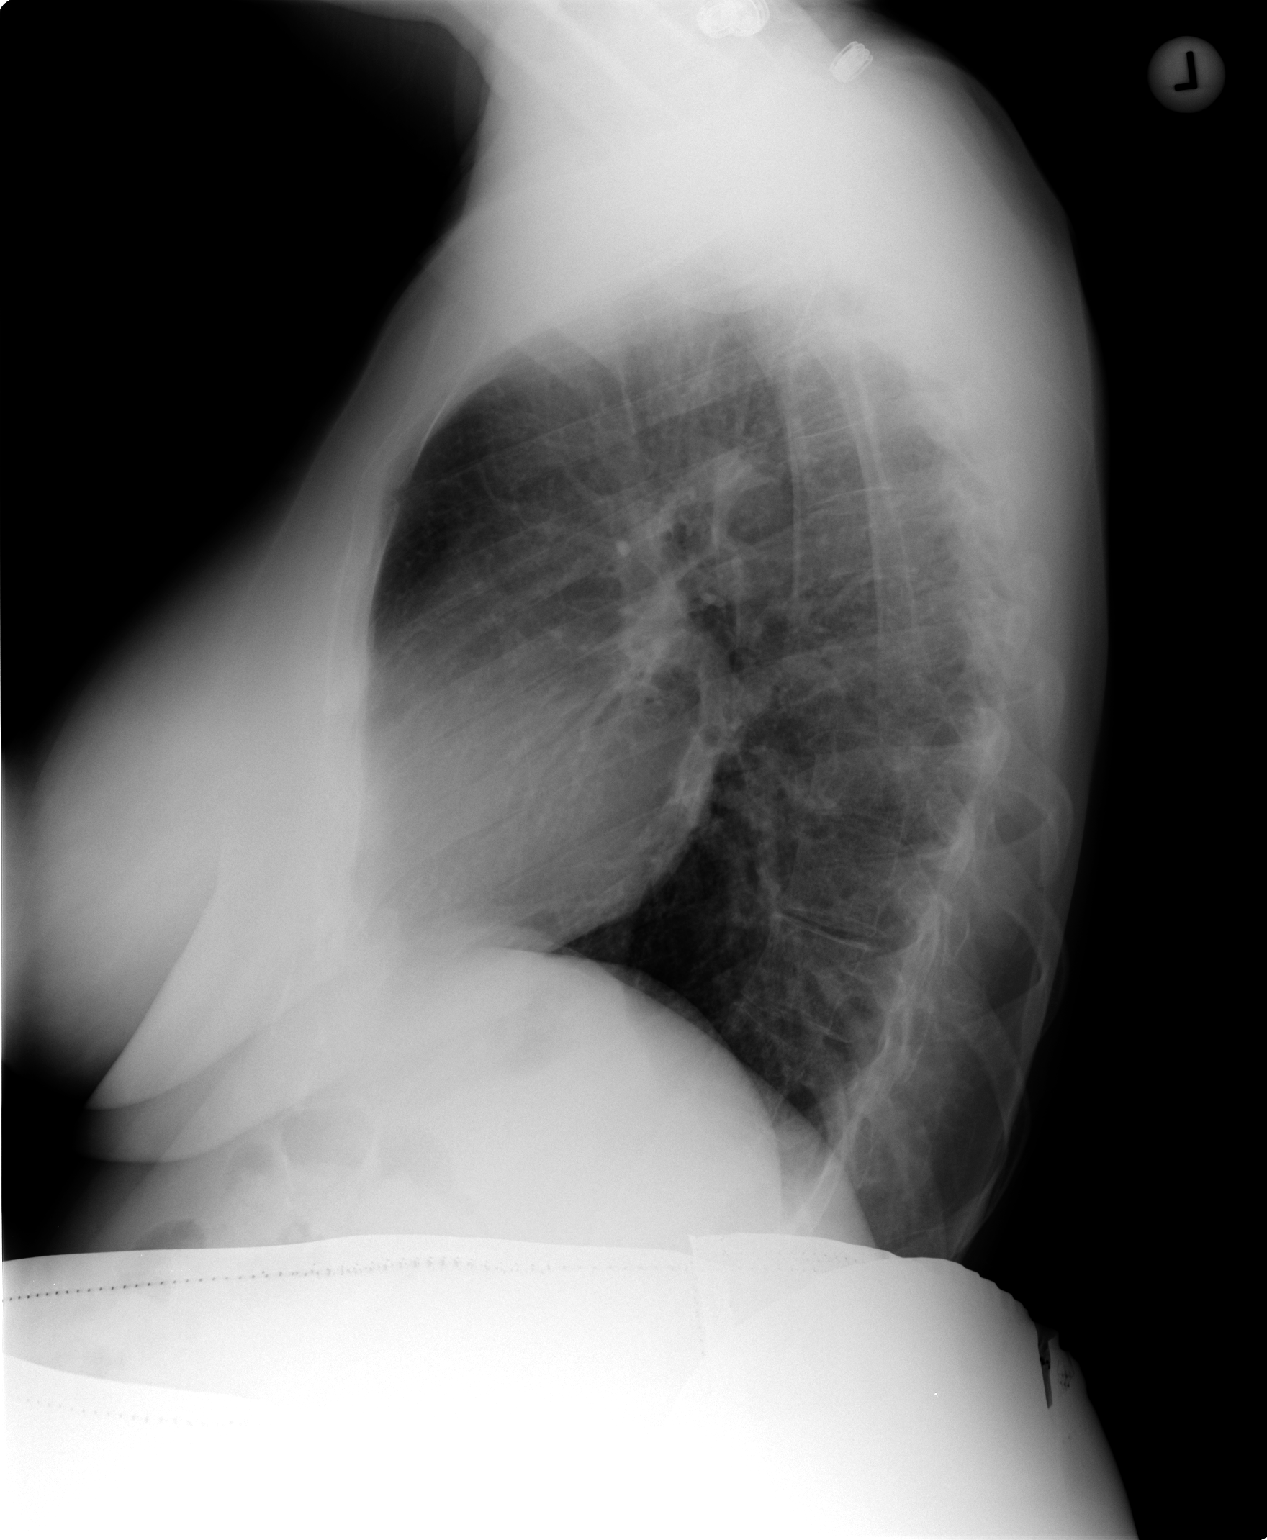

[2 of 2 positions shown; findings below may reference images not displayed]

FINDINGS: Cardiac silhouette is at the upper range of normal size.
Mediastinal and hilar contours are normal.  Lungs are free of
infiltrates.  No pleural disease is evident.  There is
thoracolumbar scoliosis with upper curve convexity to the left and
lower curve convexity to the right
IMPRESSION: No cardiopulmonary abnormalities are identified.  Scoliosis.

## 2014-03-25 ENCOUNTER — Encounter (HOSPITAL_COMMUNITY): Payer: Self-pay | Admitting: *Deleted

## 2018-07-14 ENCOUNTER — Ambulatory Visit: Payer: Self-pay | Admitting: Mental Health

## 2018-07-14 DIAGNOSIS — F489 Nonpsychotic mental disorder, unspecified: Secondary | ICD-10-CM

## 2018-07-14 DIAGNOSIS — Z0389 Encounter for observation for other suspected diseases and conditions ruled out: Principal | ICD-10-CM

## 2018-07-14 NOTE — Progress Notes (Addendum)
Entered in error. Please disregard
